# Patient Record
Sex: Male | Born: 1935 | Race: White | Hispanic: No | Marital: Married
Health system: Southern US, Community
[De-identification: ages and names within clinical notes are randomized; demographics above are authoritative.]

---

## 2005-04-11 ENCOUNTER — Other Ambulatory Visit: Payer: Self-pay

## 2005-04-11 ENCOUNTER — Emergency Department: Payer: Self-pay | Admitting: Emergency Medicine

## 2005-04-12 ENCOUNTER — Ambulatory Visit: Payer: Self-pay | Admitting: Emergency Medicine

## 2005-04-20 ENCOUNTER — Ambulatory Visit: Payer: Self-pay | Admitting: Internal Medicine

## 2006-03-12 ENCOUNTER — Ambulatory Visit: Payer: Self-pay | Admitting: Ophthalmology

## 2006-03-27 IMAGING — CT CT ANGIOGRAPHY NECK
1 of 8 series · 8 of 33 positions shown · IV contrast (APPLIED)
Comparison: none

REASON FOR EXAM: Dizziness
COMMENTS:

[Series 5: inspace · axial · 0.39mm/px · z∈[+40,+248]mm · 8 of 535 slices shown]
[im 60/535  soft-tissue]
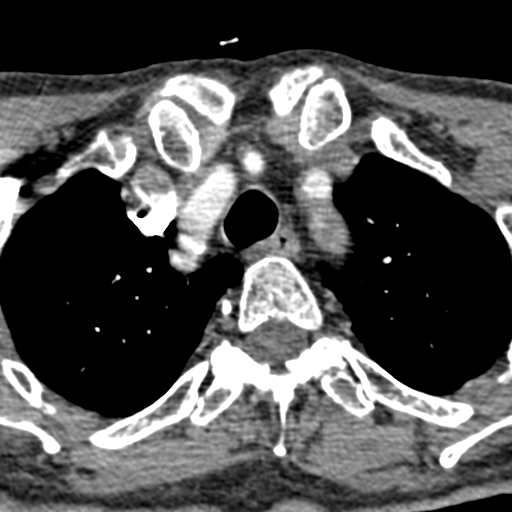
[im 119/535  bone]
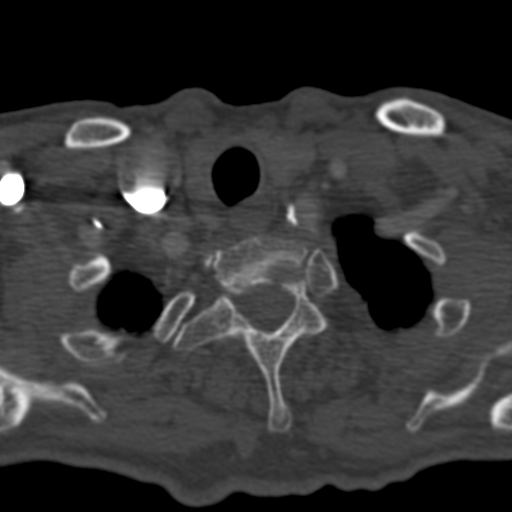
[im 179/535  soft-tissue]
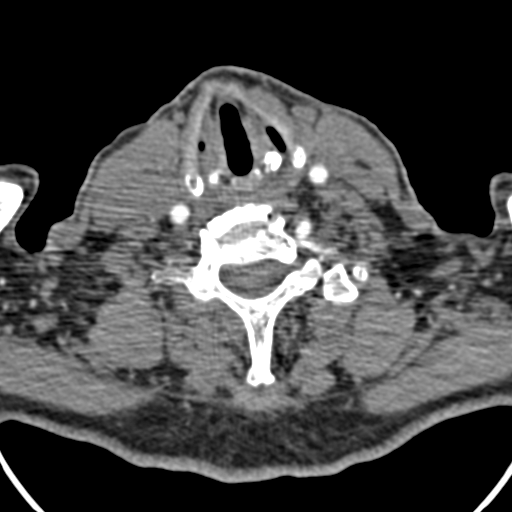
[im 238/535  bone]
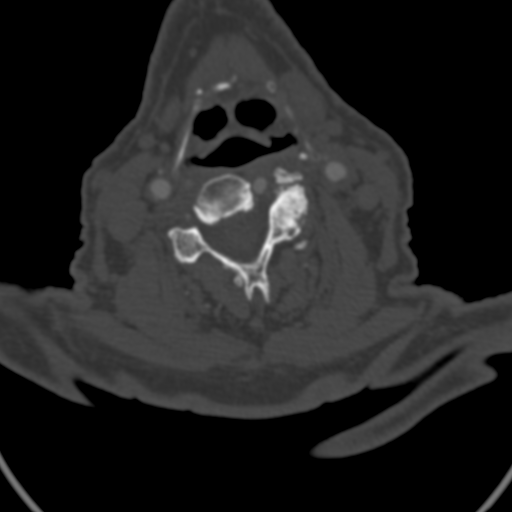
[im 297/535  soft-tissue]
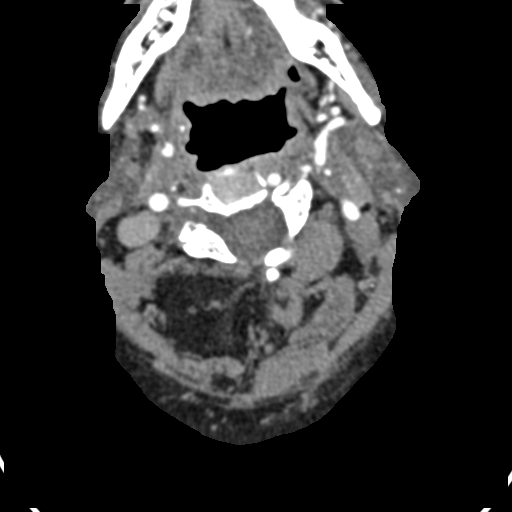
[im 357/535  bone]
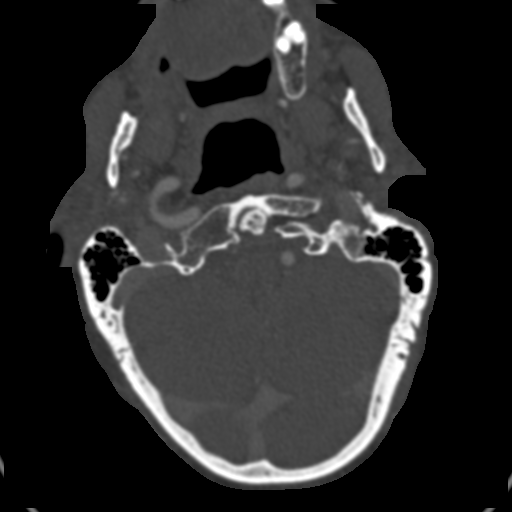
[im 416/535  soft-tissue]
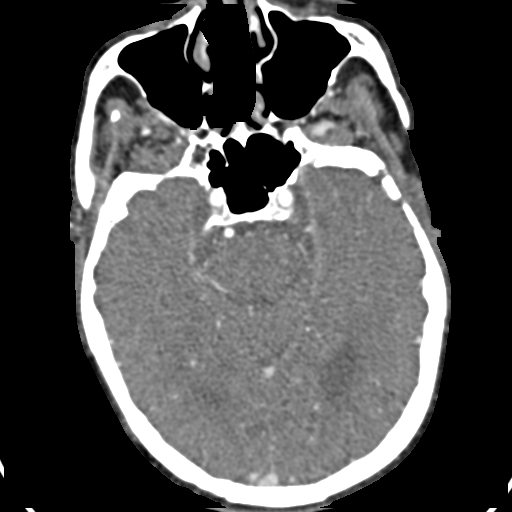
[im 475/535  bone]
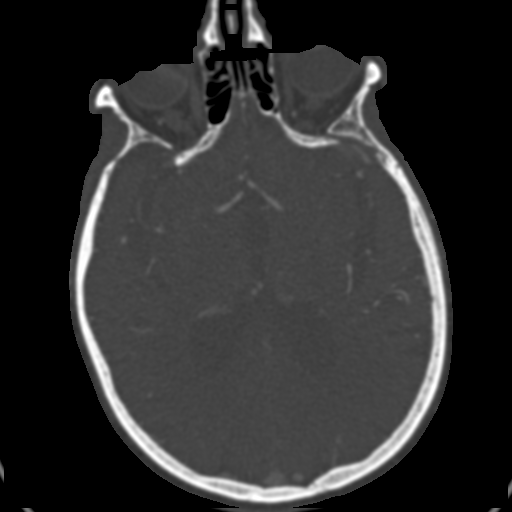

[8 of 33 positions shown; findings below may reference images not displayed]

PROCEDURE:     CT  - CT ANGIOGRAPHY NECK W/WO  - April 20, 2005  [DATE]

RESULT:     Axial source images, 2D maximum intensity projection images as
well as 3D reconstructions were obtained of the RIGHT and LEFT carotid
systems. Evaluation of the RIGHT carotid system demonstrates a coarse mural
plaque in the region of the carotid bulb extending into the proximal RIGHT
internal carotid artery.  This partially obscures visualization of the
carotid bulb and proximal RIGHT carotid artery though the obscuration
appears to be approximately 45 to 50%.  There does not appear to be
significant stenosis within the visualized portions of the proximal RIGHT
carotid artery and bulb.  Evaluation of the remaining RIGHT carotid system
demonstrates no further evaluation of stenosis, focal outpouchings,
strictural narrowing or fusiform dilatation.

Evaluation of the LEFT carotid system demonstrates no evidence of strictural
narrowing, focal outpouchings, or fusiform dilatation.

Evaluation of the RIGHT vertebral artery demonstrates diffuse severe
narrowing of the artery to the level of its origin.  This may be secondary
to diffuse severe atherosclerotic disease or may be secondary to etiology
such as congenital hypoplasia.  The LEFT vertebral artery is opacified and
demonstrates no evidence of focal outpouchings, strictural narrowing,
fusiform dilatation or focal regions of stenosis.
IMPRESSION: 1)Diffuse severe narrowing of the RIGHT vertebral artery as described above
which correlates with the ultrasound findings. It may be secondary as stated
above to diffuse severe atherosclerotic disease or possibly the sequela of
congenital hypoplasia.

2)A calcified mural plaque is demonstrated in the region of the RIGHT
carotid bulb which causes approximately 45 to 50% obscuration of the vessel.
 The remaining visualized portions of the vessel appear patent and do not
demonstrate evidence of high-grade stenosis. This finding can be further
evaluated with MRA if clinically warranted.

3)No further regions of strictural narrowing, fusiform dilatation or focal
outpouchings are appreciated within the RIGHT or LEFT carotid system nor
within the LEFT vertebral system.

## 2007-08-15 ENCOUNTER — Ambulatory Visit: Payer: Self-pay | Admitting: Internal Medicine

## 2007-09-16 ENCOUNTER — Ambulatory Visit: Payer: Self-pay | Admitting: Internal Medicine

## 2007-10-15 ENCOUNTER — Ambulatory Visit: Payer: Self-pay | Admitting: Internal Medicine

## 2008-11-17 ENCOUNTER — Ambulatory Visit: Payer: Self-pay | Admitting: Internal Medicine

## 2010-02-17 ENCOUNTER — Emergency Department: Payer: Self-pay | Admitting: Emergency Medicine

## 2010-04-07 ENCOUNTER — Inpatient Hospital Stay: Payer: Self-pay | Admitting: Unknown Physician Specialty

## 2012-01-21 LAB — CBC
HCT: 43.7 % (ref 40.0–52.0)
HGB: 14.6 g/dL (ref 13.0–18.0)
MCH: 32.9 pg (ref 26.0–34.0)
MCHC: 33.4 g/dL (ref 32.0–36.0)
MCV: 99 fL (ref 80–100)
Platelet: 233 10*3/uL (ref 150–440)
RBC: 4.44 10*6/uL (ref 4.40–5.90)
RDW: 12.9 % (ref 11.5–14.5)
WBC: 6 10*3/uL (ref 3.8–10.6)

## 2012-01-21 LAB — URINALYSIS, COMPLETE
Bacteria: NONE SEEN
Bilirubin,UR: NEGATIVE
Blood: NEGATIVE
Glucose,UR: NEGATIVE mg/dL (ref 0–75)
Hyaline Cast: 1
Ketone: NEGATIVE
Leukocyte Esterase: NEGATIVE
Nitrite: NEGATIVE
Ph: 6 (ref 4.5–8.0)
Protein: NEGATIVE
RBC,UR: NONE SEEN /HPF (ref 0–5)
Specific Gravity: 1.012 (ref 1.003–1.030)
Squamous Epithelial: NONE SEEN
WBC UR: 1 /HPF (ref 0–5)

## 2012-01-21 LAB — COMPREHENSIVE METABOLIC PANEL
Albumin: 3.6 g/dL (ref 3.4–5.0)
Alkaline Phosphatase: 67 U/L (ref 50–136)
Anion Gap: 6 — ABNORMAL LOW (ref 7–16)
BUN: 8 mg/dL (ref 7–18)
Bilirubin,Total: 0.5 mg/dL (ref 0.2–1.0)
Calcium, Total: 8.5 mg/dL (ref 8.5–10.1)
Chloride: 104 mmol/L (ref 98–107)
Co2: 33 mmol/L — ABNORMAL HIGH (ref 21–32)
Creatinine: 1.05 mg/dL (ref 0.60–1.30)
EGFR (African American): >60
EGFR (Non-African Amer.): >60
Glucose: 94 mg/dL (ref 65–99)
Osmolality: 283 (ref 275–301)
Potassium: 2.8 mmol/L — ABNORMAL LOW (ref 3.5–5.1)
SGOT(AST): 22 U/L (ref 15–37)
SGPT (ALT): 14 U/L
Sodium: 143 mmol/L (ref 136–145)
Total Protein: 6.9 g/dL (ref 6.4–8.2)

## 2012-01-21 LAB — LIPASE, BLOOD: Lipase: 616 U/L — ABNORMAL HIGH (ref 73–393)

## 2012-01-22 ENCOUNTER — Inpatient Hospital Stay: Payer: Self-pay | Admitting: Internal Medicine

## 2012-01-23 LAB — CBC WITH DIFFERENTIAL/PLATELET
Basophil #: 0 10*3/uL (ref 0.0–0.1)
Basophil %: 0.2 %
Eosinophil #: 0.1 10*3/uL (ref 0.0–0.7)
Eosinophil %: 2.2 %
HCT: 41.1 % (ref 40.0–52.0)
HGB: 13.8 g/dL (ref 13.0–18.0)
Lymphocyte %: 23.6 %
MCH: 33.1 pg (ref 26.0–34.0)
MCHC: 33.5 g/dL (ref 32.0–36.0)
Neutrophil %: 63.4 %
RDW: 12.9 % (ref 11.5–14.5)
WBC: 6 10*3/uL (ref 3.8–10.6)

## 2012-01-23 LAB — BASIC METABOLIC PANEL
Chloride: 107 mmol/L (ref 98–107)
Co2: 29 mmol/L (ref 21–32)
Creatinine: 0.9 mg/dL (ref 0.60–1.30)
EGFR (African American): >60
EGFR (Non-African Amer.): >60
Glucose: 81 mg/dL (ref 65–99)
Sodium: 145 mmol/L (ref 136–145)

## 2012-01-23 LAB — LIPASE, BLOOD: Lipase: 235 U/L (ref 73–393)

## 2012-01-23 LAB — TSH: Thyroid Stimulating Horm: 1.2 u[IU]/mL

## 2012-01-24 LAB — CBC WITH DIFFERENTIAL/PLATELET
Basophil #: 0 10*3/uL (ref 0.0–0.1)
Basophil %: 0.2 %
Eosinophil #: 0.2 10*3/uL (ref 0.0–0.7)
Eosinophil %: 3.2 %
HCT: 37.6 % — ABNORMAL LOW (ref 40.0–52.0)
HGB: 12.6 g/dL — ABNORMAL LOW (ref 13.0–18.0)
Lymphocyte %: 29.4 %
MCH: 32.8 pg (ref 26.0–34.0)
MCHC: 33.5 g/dL (ref 32.0–36.0)
Monocyte %: 11.6 %
Neutrophil #: 2.7 10*3/uL (ref 1.4–6.5)
Neutrophil %: 55.6 %
Platelet: 198 10*3/uL (ref 150–440)
RBC: 3.84 10*6/uL — ABNORMAL LOW (ref 4.40–5.90)

## 2012-01-24 LAB — BASIC METABOLIC PANEL
BUN: 6 mg/dL — ABNORMAL LOW (ref 7–18)
Calcium, Total: 7.7 mg/dL — ABNORMAL LOW (ref 8.5–10.1)
Chloride: 108 mmol/L — ABNORMAL HIGH (ref 98–107)
EGFR (Non-African Amer.): >60
Osmolality: 285 (ref 275–301)
Potassium: 3.2 mmol/L — ABNORMAL LOW (ref 3.5–5.1)
Sodium: 145 mmol/L (ref 136–145)

## 2012-01-24 LAB — CLOSTRIDIUM DIFFICILE BY PCR

## 2012-01-24 LAB — WBCS, STOOL

## 2012-01-25 LAB — BASIC METABOLIC PANEL
Calcium, Total: 8.2 mg/dL — ABNORMAL LOW (ref 8.5–10.1)
Chloride: 106 mmol/L (ref 98–107)
Co2: 32 mmol/L (ref 21–32)
Creatinine: 0.83 mg/dL (ref 0.60–1.30)
EGFR (African American): >60
Osmolality: 287 (ref 275–301)
Potassium: 3.4 mmol/L — ABNORMAL LOW (ref 3.5–5.1)
Sodium: 144 mmol/L (ref 136–145)

## 2012-01-26 LAB — STOOL CULTURE

## 2012-11-12 ENCOUNTER — Ambulatory Visit: Payer: Self-pay | Admitting: Ophthalmology

## 2013-03-20 ENCOUNTER — Emergency Department: Payer: Self-pay | Admitting: Emergency Medicine

## 2013-03-20 LAB — CBC
HCT: 40.8 % (ref 40.0–52.0)
MCH: 33.4 pg (ref 26.0–34.0)
MCHC: 33.7 g/dL (ref 32.0–36.0)
MCV: 99 fL (ref 80–100)
RBC: 4.12 10*6/uL — ABNORMAL LOW (ref 4.40–5.90)
RDW: 13.2 % (ref 11.5–14.5)
WBC: 5.5 10*3/uL (ref 3.8–10.6)

## 2013-03-20 LAB — URINALYSIS, COMPLETE
Bacteria: NONE SEEN
Bilirubin,UR: NEGATIVE
Blood: NEGATIVE
Ketone: NEGATIVE
Ph: 7 (ref 4.5–8.0)
Protein: NEGATIVE
Specific Gravity: 1.013 (ref 1.003–1.030)
Squamous Epithelial: <1
WBC UR: NONE SEEN /HPF (ref 0–5)

## 2013-03-20 LAB — TROPONIN I: Troponin-I: <0.02 ng/mL

## 2013-03-20 LAB — COMPREHENSIVE METABOLIC PANEL
Albumin: 3.6 g/dL (ref 3.4–5.0)
Alkaline Phosphatase: 67 U/L (ref 50–136)
Calcium, Total: 8.4 mg/dL — ABNORMAL LOW (ref 8.5–10.1)
Chloride: 109 mmol/L — ABNORMAL HIGH (ref 98–107)
Co2: 28 mmol/L (ref 21–32)
Creatinine: 1.01 mg/dL (ref 0.60–1.30)
EGFR (African American): >60
Osmolality: 282 (ref 275–301)
Potassium: 4.1 mmol/L (ref 3.5–5.1)
SGOT(AST): 23 U/L (ref 15–37)
Sodium: 141 mmol/L (ref 136–145)
Total Protein: 6.6 g/dL (ref 6.4–8.2)

## 2013-03-20 LAB — PRO B NATRIURETIC PEPTIDE: B-Type Natriuretic Peptide: 175 pg/mL (ref 0–450)

## 2013-03-20 LAB — CK TOTAL AND CKMB (NOT AT ARMC): CK, Total: 56 U/L (ref 35–232)

## 2013-03-20 LAB — LIPASE, BLOOD: Lipase: 169 U/L (ref 73–393)

## 2014-08-14 ENCOUNTER — Emergency Department: Payer: Self-pay | Admitting: Emergency Medicine

## 2014-08-14 LAB — URINALYSIS, COMPLETE
BILIRUBIN, UR: NEGATIVE
Bacteria: NONE SEEN
Blood: NEGATIVE
GLUCOSE, UR: NEGATIVE mg/dL (ref 0–75)
Ketone: NEGATIVE
Leukocyte Esterase: NEGATIVE
Nitrite: NEGATIVE
PROTEIN: NEGATIVE
Ph: 6 (ref 4.5–8.0)
RBC,UR: NONE SEEN /HPF (ref 0–5)
SPECIFIC GRAVITY: 1.013 (ref 1.003–1.030)
Squamous Epithelial: NONE SEEN

## 2014-08-14 LAB — BASIC METABOLIC PANEL
Anion Gap: 8 (ref 7–16)
BUN: 17 mg/dL (ref 7–18)
CREATININE: 1 mg/dL (ref 0.60–1.30)
Calcium, Total: 8.3 mg/dL — ABNORMAL LOW (ref 8.5–10.1)
Chloride: 108 mmol/L — ABNORMAL HIGH (ref 98–107)
Co2: 27 mmol/L (ref 21–32)
EGFR (African American): >60
GLUCOSE: 91 mg/dL (ref 65–99)
Osmolality: 286 (ref 275–301)
POTASSIUM: 4.1 mmol/L (ref 3.5–5.1)
SODIUM: 143 mmol/L (ref 136–145)

## 2014-08-14 LAB — CBC
HCT: 39.7 % — AB (ref 40.0–52.0)
HGB: 12.9 g/dL — AB (ref 13.0–18.0)
MCH: 32.4 pg (ref 26.0–34.0)
MCHC: 32.5 g/dL (ref 32.0–36.0)
MCV: 100 fL (ref 80–100)
PLATELETS: 336 10*3/uL (ref 150–440)
RBC: 3.98 10*6/uL — ABNORMAL LOW (ref 4.40–5.90)
RDW: 12.8 % (ref 11.5–14.5)
WBC: 7 10*3/uL (ref 3.8–10.6)

## 2014-08-14 LAB — TROPONIN I

## 2014-09-01 ENCOUNTER — Emergency Department: Payer: Self-pay | Admitting: Emergency Medicine

## 2014-09-02 ENCOUNTER — Emergency Department: Payer: Self-pay | Admitting: Emergency Medicine

## 2014-09-08 ENCOUNTER — Inpatient Hospital Stay: Payer: Self-pay | Admitting: Internal Medicine

## 2014-09-08 LAB — BASIC METABOLIC PANEL
Anion Gap: 6 — ABNORMAL LOW (ref 7–16)
BUN: 23 mg/dL — ABNORMAL HIGH (ref 7–18)
CALCIUM: 8.4 mg/dL — AB (ref 8.5–10.1)
CHLORIDE: 108 mmol/L — AB (ref 98–107)
Co2: 26 mmol/L (ref 21–32)
Creatinine: 0.99 mg/dL (ref 0.60–1.30)
EGFR (Non-African Amer.): >60
Glucose: 98 mg/dL (ref 65–99)
OSMOLALITY: 283 (ref 275–301)
Potassium: 4.1 mmol/L (ref 3.5–5.1)
SODIUM: 140 mmol/L (ref 136–145)

## 2014-09-08 LAB — CBC
HCT: 40.2 % (ref 40.0–52.0)
HGB: 12.7 g/dL — AB (ref 13.0–18.0)
MCH: 31.5 pg (ref 26.0–34.0)
MCHC: 31.5 g/dL — AB (ref 32.0–36.0)
MCV: 100 fL (ref 80–100)
Platelet: 267 10*3/uL (ref 150–440)
RBC: 4.03 10*6/uL — AB (ref 4.40–5.90)
RDW: 12.8 % (ref 11.5–14.5)
WBC: 6.8 10*3/uL (ref 3.8–10.6)

## 2014-09-08 LAB — URINALYSIS, COMPLETE
BACTERIA: NONE SEEN
Bilirubin,UR: NEGATIVE
Glucose,UR: NEGATIVE mg/dL (ref 0–75)
Ketone: NEGATIVE
LEUKOCYTE ESTERASE: NEGATIVE
Nitrite: NEGATIVE
Ph: 5 (ref 4.5–8.0)
Protein: NEGATIVE
RBC,UR: 175 /HPF (ref 0–5)
SPECIFIC GRAVITY: 1.024 (ref 1.003–1.030)
Squamous Epithelial: NONE SEEN
WBC UR: 3 /HPF (ref 0–5)

## 2014-09-08 LAB — PROTIME-INR
INR: 1.1
PROTHROMBIN TIME: 13.9 s (ref 11.5–14.7)

## 2014-09-09 LAB — HEMOGLOBIN: HGB: 12.6 g/dL — ABNORMAL LOW (ref 13.0–18.0)

## 2014-09-10 ENCOUNTER — Ambulatory Visit: Payer: Self-pay | Admitting: Internal Medicine

## 2014-09-10 LAB — CBC WITH DIFFERENTIAL/PLATELET
BASOS ABS: 0 10*3/uL (ref 0.0–0.1)
Basophil %: 0.1 %
EOS ABS: 0 10*3/uL (ref 0.0–0.7)
Eosinophil %: 0.3 %
HCT: 33 % — ABNORMAL LOW (ref 40.0–52.0)
HGB: 11 g/dL — ABNORMAL LOW (ref 13.0–18.0)
Lymphocyte #: 0.7 10*3/uL — ABNORMAL LOW (ref 1.0–3.6)
Lymphocyte %: 8.1 %
MCH: 32.9 pg (ref 26.0–34.0)
MCHC: 33.3 g/dL (ref 32.0–36.0)
MCV: 99 fL (ref 80–100)
Monocyte #: 0.7 x10 3/mm (ref 0.2–1.0)
Monocyte %: 8 %
NEUTROS ABS: 7.1 10*3/uL — AB (ref 1.4–6.5)
Neutrophil %: 83.5 %
PLATELETS: 245 10*3/uL (ref 150–440)
RBC: 3.34 10*6/uL — ABNORMAL LOW (ref 4.40–5.90)
RDW: 12.7 % (ref 11.5–14.5)
WBC: 8.5 10*3/uL (ref 3.8–10.6)

## 2014-09-10 LAB — BASIC METABOLIC PANEL
Anion Gap: 4 — ABNORMAL LOW (ref 7–16)
BUN: 18 mg/dL (ref 7–18)
Calcium, Total: 7.7 mg/dL — ABNORMAL LOW (ref 8.5–10.1)
Chloride: 111 mmol/L — ABNORMAL HIGH (ref 98–107)
Co2: 25 mmol/L (ref 21–32)
Creatinine: 1.15 mg/dL (ref 0.60–1.30)
EGFR (Non-African Amer.): >60
Glucose: 139 mg/dL — ABNORMAL HIGH (ref 65–99)
OSMOLALITY: 284 (ref 275–301)
POTASSIUM: 4.1 mmol/L (ref 3.5–5.1)
Sodium: 140 mmol/L (ref 136–145)

## 2014-09-11 LAB — BASIC METABOLIC PANEL
Anion Gap: 4 — ABNORMAL LOW (ref 7–16)
BUN: 12 mg/dL (ref 7–18)
CALCIUM: 7.7 mg/dL — AB (ref 8.5–10.1)
CHLORIDE: 114 mmol/L — AB (ref 98–107)
CO2: 25 mmol/L (ref 21–32)
CREATININE: 0.99 mg/dL (ref 0.60–1.30)
EGFR (African American): >60
EGFR (Non-African Amer.): >60
Glucose: 113 mg/dL — ABNORMAL HIGH (ref 65–99)
OSMOLALITY: 286 (ref 275–301)
Potassium: 3.6 mmol/L (ref 3.5–5.1)
SODIUM: 143 mmol/L (ref 136–145)

## 2014-09-11 LAB — URINALYSIS, COMPLETE
BACTERIA: NONE SEEN
BILIRUBIN, UR: NEGATIVE
BLOOD: NEGATIVE
Glucose,UR: NEGATIVE mg/dL (ref 0–75)
Ketone: NEGATIVE
Leukocyte Esterase: NEGATIVE
NITRITE: NEGATIVE
PH: 5 (ref 4.5–8.0)
Protein: NEGATIVE
RBC,UR: 2 /HPF (ref 0–5)
SPECIFIC GRAVITY: 1.02 (ref 1.003–1.030)
SQUAMOUS EPITHELIAL: NONE SEEN

## 2014-09-11 LAB — HEMOGLOBIN: HGB: 10.4 g/dL — ABNORMAL LOW (ref 13.0–18.0)

## 2014-09-12 LAB — CBC WITH DIFFERENTIAL/PLATELET
BASOS ABS: 0 10*3/uL (ref 0.0–0.1)
BASOS PCT: 0.2 %
EOS PCT: 1 %
Eosinophil #: 0.1 10*3/uL (ref 0.0–0.7)
HCT: 27.3 % — AB (ref 40.0–52.0)
HGB: 9.1 g/dL — ABNORMAL LOW (ref 13.0–18.0)
LYMPHS PCT: 14.2 %
Lymphocyte #: 0.9 10*3/uL — ABNORMAL LOW (ref 1.0–3.6)
MCH: 33 pg (ref 26.0–34.0)
MCHC: 33.4 g/dL (ref 32.0–36.0)
MCV: 99 fL (ref 80–100)
Monocyte #: 0.7 x10 3/mm (ref 0.2–1.0)
Monocyte %: 10.8 %
Neutrophil #: 4.8 10*3/uL (ref 1.4–6.5)
Neutrophil %: 73.8 %
Platelet: 244 10*3/uL (ref 150–440)
RBC: 2.76 10*6/uL — ABNORMAL LOW (ref 4.40–5.90)
RDW: 12.9 % (ref 11.5–14.5)
WBC: 6.5 10*3/uL (ref 3.8–10.6)

## 2014-09-12 LAB — PATHOLOGY REPORT

## 2014-09-12 LAB — BASIC METABOLIC PANEL
Anion Gap: 9 (ref 7–16)
BUN: 15 mg/dL (ref 7–18)
CHLORIDE: 113 mmol/L — AB (ref 98–107)
CREATININE: 0.89 mg/dL (ref 0.60–1.30)
Calcium, Total: 7.6 mg/dL — ABNORMAL LOW (ref 8.5–10.1)
Co2: 22 mmol/L (ref 21–32)
EGFR (African American): >60
EGFR (Non-African Amer.): >60
Glucose: 80 mg/dL (ref 65–99)
Osmolality: 287 (ref 275–301)
POTASSIUM: 3.3 mmol/L — AB (ref 3.5–5.1)
Sodium: 144 mmol/L (ref 136–145)

## 2014-09-13 LAB — BASIC METABOLIC PANEL
ANION GAP: 10 (ref 7–16)
BUN: 20 mg/dL — ABNORMAL HIGH (ref 7–18)
CALCIUM: 7.7 mg/dL — AB (ref 8.5–10.1)
CHLORIDE: 113 mmol/L — AB (ref 98–107)
CO2: 21 mmol/L (ref 21–32)
CREATININE: 0.9 mg/dL (ref 0.60–1.30)
EGFR (Non-African Amer.): >60
GLUCOSE: 66 mg/dL (ref 65–99)
OSMOLALITY: 288 (ref 275–301)
Potassium: 3.6 mmol/L (ref 3.5–5.1)
SODIUM: 144 mmol/L (ref 136–145)

## 2014-09-13 LAB — CBC WITH DIFFERENTIAL/PLATELET
Basophil #: 0 10*3/uL (ref 0.0–0.1)
Basophil %: 0.1 %
Eosinophil #: 0.1 10*3/uL (ref 0.0–0.7)
Eosinophil %: 1.8 %
HCT: 27.9 % — ABNORMAL LOW (ref 40.0–52.0)
HGB: 9.4 g/dL — ABNORMAL LOW (ref 13.0–18.0)
Lymphocyte #: 1.1 10*3/uL (ref 1.0–3.6)
Lymphocyte %: 15.8 %
MCH: 33.6 pg (ref 26.0–34.0)
MCHC: 33.8 g/dL (ref 32.0–36.0)
MCV: 99 fL (ref 80–100)
Monocyte #: 0.6 x10 3/mm (ref 0.2–1.0)
Monocyte %: 8.3 %
NEUTROS ABS: 5 10*3/uL (ref 1.4–6.5)
NEUTROS PCT: 74 %
PLATELETS: 283 10*3/uL (ref 150–440)
RBC: 2.82 10*6/uL — ABNORMAL LOW (ref 4.40–5.90)
RDW: 13.1 % (ref 11.5–14.5)
WBC: 6.8 10*3/uL (ref 3.8–10.6)

## 2014-09-13 LAB — URINE CULTURE

## 2014-09-14 LAB — CBC WITH DIFFERENTIAL/PLATELET
BASOS ABS: 0 10*3/uL (ref 0.0–0.1)
BASOS PCT: 0.2 %
Eosinophil #: 0.2 10*3/uL (ref 0.0–0.7)
Eosinophil %: 2.9 %
HCT: 27.1 % — ABNORMAL LOW (ref 40.0–52.0)
HGB: 8.7 g/dL — AB (ref 13.0–18.0)
Lymphocyte #: 1 10*3/uL (ref 1.0–3.6)
Lymphocyte %: 16.8 %
MCH: 32.1 pg (ref 26.0–34.0)
MCHC: 32.2 g/dL (ref 32.0–36.0)
MCV: 100 fL (ref 80–100)
MONO ABS: 0.5 x10 3/mm (ref 0.2–1.0)
Monocyte %: 9.2 %
NEUTROS ABS: 4 10*3/uL (ref 1.4–6.5)
Neutrophil %: 70.9 %
PLATELETS: 319 10*3/uL (ref 150–440)
RBC: 2.72 10*6/uL — ABNORMAL LOW (ref 4.40–5.90)
RDW: 12.8 % (ref 11.5–14.5)
WBC: 5.7 10*3/uL (ref 3.8–10.6)

## 2014-09-16 LAB — CULTURE, BLOOD (SINGLE)

## 2014-10-11 ENCOUNTER — Ambulatory Visit: Payer: Self-pay | Admitting: Internal Medicine

## 2015-04-03 NOTE — H&P (Signed)
PATIENT NAME:  Cody Daniels, Cody Daniels MR#:  [id number] DATE OF BIRTH:  [birth year]  DATE OF ADMISSION:  09/09/2014  REFERRING PHYSICIAN: Charlestine Night. Scotty Court, MD  PRIMARY CARE PHYSICIAN: Barbette Reichmann, MD, [facility]   CHIEF COMPLAINT: Unwitnessed fall.   HISTORY OF PRESENT ILLNESS: An 82 year old Caucasian gentleman with a history of dementia, hypertension, BPH, depression, presenting after unwitnessed fall at nursing facility. The patient was found on the floor, could not ambulate. Unable to provide any meaningful information given baseline dementia. Found to have right-sided hip fracture.  REVIEW OF SYSTEMS: Unable to obtain given patient's baseline mental status.   PAST MEDICAL HISTORY: Dementia, osteoarthritis, hypertension, BPH, depression.   SOCIAL HISTORY: No documentation of alcohol, tobacco, or drug usage. Resides at a nursing facility.   FAMILY HISTORY: Positive for diabetes.   ALLERGIES: No known allergies.   HOME MEDICATIONS: Aspirin 81 mg p.o. q. daily, Mapap 500 mg 2 tabs p.o. b.i.d., lisinopril 5 mg p.o. q. daily, milk of magnesia 30 mL at bedtime for constipation, Mylanta 400/400/40 mg 30 mL q. 6 hours as needed for indigestion, lorazepam 0.5 mg p.o. q. daily as needed for anxiety, paroxetine 40 mg p.o. q. daily, quetiapine 25 mg p.o. at bedtime, quetiapine 25 mg 1/2 tablet p.o. q.a.m., potassium 10 mEq p.o. q. daily, melatonin 1 mg p.o. at bedtime.   PHYSICAL EXAMINATION:  VITAL SIGNS: Temperature 37.6 axillary, heart rate 95, respirations 18, blood pressure 115/77, saturating 96% on room air. Weight 72.6 kg, BMI 24.3.  GENERAL: Chronically ill-appearing Caucasian gentleman, currently in no acute distress.  HEAD: Normocephalic, atraumatic.  EYES: Pupils equal, round, reactive to light. Extraocular muscles intact. No scleral icterus. MOUTH: Dry mucosal membranes. Dentition intact. No abscess noted. EARS, NOSE, THROAT: Clear without exudates. No external lesions.  NECK:  Supple. No thyromegaly. No nodules. No JVD.  PULMONARY: Clear to auscultation bilaterally without wheezes, rubs, or rhonchi. No use of accessory muscles. Good respiratory effort.  CHEST: Nontender to palpation.  CARDIOVASCULAR: S1, S2, regular rate and rhythm. No murmurs, rubs, or gallops. No edema. Pedal pulses 2+ bilaterally.  GASTROINTESTINAL: Soft, nontender, nondistended. No masses. Positive bowel sounds. No hepatosplenomegaly.  MUSCULOSKELETAL: No swelling, clubbing, or edema noticed. Range of motion limited in the right leg secondary to fracture.  NEUROLOGIC: Unable to fully assess given patient's mental status, as he is unable to follow any commands. He does have spontaneous movements of all extremities.  SKIN: No ulceration, lesions, rash, cyanosis. Skin warm, dry. Turgor intact.  PSYCHIATRIC: Mood and affect flat. He is awake; however, does not appear to be oriented to person, place, or time. He is unable to follow any simple commands. Insight and judgment appear to be poor.   LABORATORY DATA: EKG performed, normal sinus rhythm. No ST or T wave abnormalities. Chest x-ray performed, no acute cardiopulmonary process. X-ray of right hip reveals acute impacted right fracture. Remainder of laboratory data: Sodium 140, potassium 4.1, chloride 108, bicarbonate 26, BUN 23, creatinine 0.99, glucose 98. WBC 6.8, hemoglobin 12.7, platelets of 267,000. Urinalysis: WBCs 3, RBCs 175.   ASSESSMENT AND PLAN: An 81 year old gentleman with a history of dementia, presenting after unwitnessed fall from the nursing facility, found to have a right hip fracture.  1.  Acute impacted right hip fracture. Orthopedic consultation. Pain control. Venous thromboembolism prophylaxis per orthopedics.  2.  Preoperative evaluation. METS considered less than 4. The patient unable to provide any meaningful information. He has no active congestive heart failure or valvular disease based on the physical;  however, he should be  considered a moderate risk for moderate risk surgery. No further interventions or tests required prior to surgery. As far as medications are concerned, hold aspirin and lisinopril preoperatively. 3.  Hypertension. Continue ACE inhibitor, as above.  4.  Depression. Continue quetiapine. 5.  Venous thromboembolism prophylaxis with sequential compression devices.  CODE STATUS: Patient DNR.  TIME SPENT: 45 minutes.    ____________________________ Cletis Athensavid K. Cindy Brindisi, MD dkh:ST D: 09/09/2014 00:20:10 ET T: 09/09/2014 01:13:25 ET JOB#: [id number]  cc: Cletis Athensavid K. Tyrus Wilms, MD, <Dictator> Phillippe Orlick Synetta ShadowK Buelah Rennie MD ELECTRONICALLY SIGNED 09/16/2014 20:35

## 2015-04-03 NOTE — Discharge Summary (Signed)
PATIENT NAME:  Cody Daniels, Cody Daniels MR#:  [id number] DATE OF BIRTH:  November 14, 1924  DATE OF ADMISSION:  09/08/2014 DATE OF DISCHARGE:  09/14/2014  ADDENDUM:   The patient's discharge was held over the weekend because of a fever. Urinalysis and chest x-ray was negative. The patient did not have any obvious sores.  His temperature, subsequently, settled. He was also noted to be excessively sedated, attributed to the olanzapine and this was therefore discontinued. The patient was stable at the time of discharge and discharged to rehabilitation on 09/14/2014.   Total time spent in discharge of pt: 35 minutes   ____________________________ Barbette ReichmannVishwanath Azariah Bonura, MD vh:lr D: 09/14/2014 12:35:20 ET T: 09/14/2014 13:59:02 ET JOB#: [id number]  cc: Barbette ReichmannVishwanath Bodin Gorka, MD, <Dictator> Barbette ReichmannVISHWANATH Miguelangel Korn MD ELECTRONICALLY SIGNED 09/17/2014 13:14

## 2015-04-03 NOTE — Consult Note (Signed)
Brief Consult Note: Diagnosis: diuspalced right femoral neck fracture.   Orders entered.   Comments: plan left hip hemiarthroplasty.  Electronic Signatures: Leitha SchullerMenz, Curvin Hunger J (MD)  (Signed 29-Sep-15 22:48)  Authored: Brief Consult Note   Last Updated: 29-Sep-15 22:48 by Leitha SchullerMenz, Brendan Gruwell J (MD)

## 2015-04-03 NOTE — Op Note (Signed)
PATIENT NAME:  Cody Daniels, Cody Daniels MR#:  [id number] DATE OF BIRTH:  October 06, 1924  DATE OF PROCEDURE:  09/09/2014  PREOPERATIVE DIAGNOSIS:  Displaced right femoral neck fracture.   POSTOPERATIVE DIAGNOSIS:  Displaced right femoral neck fracture.   PROCEDURE:  Right anterior hip hemiarthroplasty.   ANESTHESIA:  Spinal.   SURGEON:  Leitha SchullerMichael J. Dejae Bernet, MD   DESCRIPTION OF PROCEDURE:  The patient was brought to the operating room, and after adequate spinal anesthesia was obtained, the patient was placed on the operative table with the right foot in the Medacta attachment. After prepping and draping in the usual sterile fashion, appropriate patient identification and timeout procedures were completed with a preoperative x-ray taken. Initially, the neck was very far impacted into the head with posterior displacement of the head. After a long traction to sit for while prepping, the fracture appeared more anatomic, and preoperative template was obtained from the x-ray. Direct anterior approach was used with incision centered over the greater trochanter and tensor fasciae latae muscle after patient identification and timeout procedures were completed. An incision was carried down through the skin and subcutaneous tissue with the tensor fasciae identified, elevated, and retracted posteriorly. The lateral femoral circumflex vessels were ligated, and after elevating the rectus, the anterior capsule was exposed. A capsulotomy was carried out, and there was hemarthrosis present consistent with acute fracture. A femoral neck cut was made at the appropriate level based on x-ray findings, and the head and neck were removed with mild arthritis present within the hip. The hip measured to a 57 mm, and the 57 mm trial fit well. The leg was then externally rotated, and a pubofemoral and ischiofemoral partial release was carried out to allow for adequate mobilization of the proximal femur. The leg was dropped into extension, and  sequential broaching was carried out up to a size 5, which gave good fill, but it did appear to have more anteversion than is typically noted but appeared appropriate for this patient. The 5 stem was then impacted down the canal with an S-28 mm head and the 57 mm bipolar head. The bipolar head was placed into the acetabulum with the S head on the stem. The hip was internally rotated with traction, and then traction was released and the bipolar components assembled inside the acetabulum. The hip was stable through range of motion including 90 degrees of external rotation. The wound was then thoroughly irrigated. The capsule was repaired, with the patient's confusion to try to aid in diminishing the risk of anterior dislocation postoperatively, with Ethibond suture repair of the anterior capsule, heavy Quill for the tensor fasciae muscle, and 2-0 Quill subcutaneously followed by skin staples, Xeroform, 4 x 4's, ABD,  and tape. The patient was sent to the recovery room in stable condition.   ESTIMATED BLOOD LOSS:  400 mL.   COMPLICATIONS:  None.   SPECIMEN:  Removed femoral head and neck.   IMPLANTS:  Medacta 5 AMIStem with an S-28 head and a 57 mm bipolar head.   CONDITION:  There were again no complications. Stable to the recovery room.    ____________________________ Leitha SchullerMichael J. Maude Hettich, MD mjm:nb D: 09/09/2014 20:52:46 ET T: 09/10/2014 02:11:45 ET JOB#: [id number]  cc: Leitha SchullerMichael J. Shalaunda Weatherholtz, MD, <Dictator> Leitha SchullerMICHAEL J Aarsh Fristoe MD ELECTRONICALLY SIGNED 09/13/2014 22:04

## 2015-04-03 NOTE — Discharge Summary (Signed)
PATIENT NAME:  Cody Daniels, Cody Daniels MR#:  [id number] DATE OF BIRTH:  [birth year]  DATE OF ADMISSION:  09/08/2014 DATE OF DISCHARGE:  09/14/2014   DIAGNOSES AT THE TIME OF DISCHARGE:   1.  Fall with impacted right hip fracture, status post right hip hemiarthroplasty.  2.  Hypertension.  3.  Dementia.  4.  Anxiety and agitation.  5   Anemia secondary to blood loss  CHIEF COMPLAINT: Difficulty in ambulation, pain right hip.   HISTORY OF PRESENT ILLNESS: Cody Daniels is an 81 year old gentleman with a history of dementia, hypertension, BPH, depression who presented after he fell at the nursing facility. The patient was found on the floor unable to ambulate and x-ray showed evidence for impacted right femoral neck fracture. The patient was seen by orthopedics and underwent a right hemiarthroplasty by Dr. Rosita KeaMenz. Past medical history significant for dementia, osteoarthritis, hypertension, BPH and depression. Please see H and P for other details.   HOSPITAL COURSE: During his stay in the hospital, the patient had episodes of anxiety and agitation and needed intravenous Haldol. He was also seen by palliative care who recommended Seroquel in addition to olanzapine. The patient also received physical therapy. His hemoglobin did drop slightly to 10.4. This was felt to be due to postop blood loss but the patient overall appeared stable at the time of discharge. He was discharged in stable condition and advised to undergo further physical therapy and was sent to rehab.   MEDICATIONS: The patient's medications on discharge:  Paxil 40 mg once a day, melatonin 1 mg at bedtime, acidophilus 1 capsule once a day, lisinopril 5 mg a day, KCl 10 mEq once a day, Ativan 0.5 mg po q 6hrs prn for anxiety., Trazodone 25 mg p.o. at bedtime, Haldol 2 mg q.6 p.r.n. for agitation, gabapentin 100 mg t.i.d., Risamine topical ointment to affected area twice a day and triple antibiotic cream apply 4 times a day as needed for skin tag  abrasions. The patient was also advised to continue his Lovenox 40 mg subcutaneous daily for 10 more days.    DISCHARGE INSTRUCTIONS: The patient was advised physical therapy and advised to have a CBC and metabolic panel checked in 1 week. He was stable at the time of discharge.   TOTAL TIME SPENT ON DISCHARGING PATIENT: 35 minutes.    ____________________________ Barbette ReichmannVishwanath Joseth Weigel, MD vh:AT D: 09/11/2014 14:55:19 ET T: 09/14/2014 05:09:27 ET JOB#: [id number]  cc: Barbette ReichmannVishwanath Anajulia Leyendecker, MD, <Dictator> Barbette ReichmannVISHWANATH Kerim Statzer MD ELECTRONICALLY SIGNED 09/17/2014 13:14

## 2015-04-03 NOTE — Consult Note (Signed)
PATIENT NAME:  Cody Daniels, Cody Daniels MR#:  [id number] DATE OF BIRTH:  05-08-1924  DATE OF CONSULTATION:  09/09/2014  REFERRING PHYSICIAN:  Emergency Room  CONSULTING PHYSICIAN:  Leitha SchullerMichael J. Chrissa Meetze, MD  REASON FOR CONSULTATION: Right hip fracture.   HISTORY OF PRESENT ILLNESS: The patient is an 81 year old nursing home resident who slipped out of a wheelchair apparently. He is unable to give a history as he is quite confused. He had obvious deformity to the leg and was brought to the Emergency Room where he was found to have a displaced femoral neck fracture. Initial report was impacted, but it is actually completely displaced posteriorly. He lives in a nursing home and I do not know his ambulatory status.   PHYSICAL EXAMINATION: Pertinent to the right lower extremity is a shortened and externally rotated leg. Skin is intact. He does have palpable pulses, and does flex and extend the toes. He cannot cooperate with the exam and cannot answer questions.   IMAGING: X-rays show a completely displaced femoral neck fracture without significant osteoarthritis.   IMPRESSION: Displaced femoral neck fracture.   RECOMMENDATION: For right hip hemiarthroplasty, anterior approach, with postoperative Lovenox. We will try to do that later today. No family is present this morning. I will try to discuss this with his son later today for informed consent.     ____________________________ Leitha SchullerMichael J. Lanea Vankirk, MD mjm:MT D: 09/09/2014 06:53:47 ET T: 09/09/2014 08:09:29 ET JOB#: [id number]  cc: Leitha SchullerMichael J. Moncia Annas, MD, <Dictator> Leitha SchullerMICHAEL J Solace Manwarren MD ELECTRONICALLY SIGNED 09/09/2014 13:23

## 2015-04-04 NOTE — Consult Note (Signed)
Chief Complaint:   Subjective/Chief Complaint Feels much better. No significant diarrhea. No abdominal pain. Stool studies negative. No further recommendations. Will sign off. Please reconsult if needed. Thanks.   Electronic Signatures: Lurline DelIftikhar, Ardena Gangl (MD)  (Signed 13-Feb-13 10:42)  Authored: Chief Complaint   Last Updated: 13-Feb-13 10:42 by Lurline DelIftikhar, Mykell Rawl (MD)

## 2015-04-04 NOTE — Consult Note (Signed)
Brief Consult Note: Comments: Patient seen. Full consult dictated. Will wait for stool studies and TSH and make further recommendations.  Electronic Signatures: Lurline DelIftikhar, Aryaman Haliburton (MD)  (Signed 11-Feb-13 23:56)  Authored: Brief Consult Note   Last Updated: 11-Feb-13 23:56 by Lurline DelIftikhar, Azhia Siefken (MD)

## 2015-04-04 NOTE — Discharge Summary (Signed)
PATIENT NAME:  Cody KnucklesWADE, Hamad C MR#:  [id number] DATE OF BIRTH:  [birth year]  DATE OF ADMISSION:  01/22/2012 DATE OF DISCHARGE:  01/25/2012  DISCHARGE DIAGNOSES:  1. Diarrhea. 2. Weakness. 3. Hypokalemia. 4. Advanced dementia. 5. Osteoarthritis. 6. Hypertension. 7. Pancreatitis.   CHIEF COMPLAINT: Diarrhea, abdominal discomfort.   HISTORY OF PRESENT ILLNESS: Barrie LymeWilliam Henken is an 81 year old man who comes from home complaining of ongoing diarrhea. According to his son, the patient states that he has been having diarrhea for the last few weeks, sometimes up to five times a day, loose and watery, but no blood in it. The patient has also been complaining of some vague upper abdominal pain. No fevers, no chills, and no vomiting. PO intake has been good.  PAST MEDICAL HISTORY:  1. Dementia. 2. Osteoarthritis. 3. Hypertension. 4. Benign prostatic hypertrophy. 5. Depression. 6. Osteoporosis.   PAST SURGICAL HISTORY:  1. Hernia repair. 2. Left total knee replacement.  3. Right distal femur fracture. 4. Right hernia surgery.    PHYSICAL EXAMINATION: He was afebrile. Pulse was 68, respirations 24, blood pressure 149/103, and saturations were 95% on room air. He was not in distress. HEENT was normocephalic, atraumatic. Sclera was anicteric. Neck was supple. No JVD. Heart with S1 and S2. Lungs had decreased air entry bilaterally, no crackles or wheezes. Abdomen was soft, mild epigastric tenderness present. No rebound or guarding. Extremities showed no edema. Neurologically he was alert and oriented x1. He was confused, but no obvious focal deficits noted. Skin was moist, no rash appreciated.   LABS/STUDIES: Glucose 94, BUN 8, creatinine 1.05, sodium 143, potassium 2.8, chloride 104, bicarbonate 33. Magnesium 2.1.   Urinalysis was normal.   Hemoglobin 14.6, hematocrit 43.7, and platelets 233. Serum lipase was elevated at 616. Repeat serum lipase was down to 235. TSH was 1.2.  Stool was negative  for Clostridium difficile.   EKG showed sinus bradycardia, but otherwise no acute changes.  HOSPITAL COURSE: The patient was [facility] and received IV fluids. He was kept on a liquid diet. His diet was gradually advanced. His repeat serum amylase was normal. He was also seen by gastroenterologist, Dr. Lurline DelShaukat Iftikhar, and stool studies were negative. The diarrhea had resolved, abdominal had also resolved, and the patient was stable at the time of discharge.  Pt's diarrhea may have been from his meds and his Namenda and Seroquel was d/cd   DISCHARGE MEDICATIONS: 1. Donepezil 10 mg at bedtime.  2. Lisinopril 10 mg a day. 3. Paxil 20 mg a day.  4. Aspirin 81 mg a day.        7. The patient also had a PPD placed, on 01/23/2012, and this has to be read within 48 to 72 hours.   DISCHARGE CONDITION: The patient was stable at the time of discharge. ____________________________ Barbette ReichmannVishwanath Meric Joye, MD vh:slb D: 01/25/2012 13:01:50 ET T: 01/25/2012 13:22:11 ET JOB#: [id number]  cc: Barbette ReichmannVishwanath Kaidence Sant, MD, <Dictator> Barbette ReichmannVISHWANATH Dorsie Burich MD ELECTRONICALLY SIGNED 02/02/2012 15:26

## 2015-04-04 NOTE — H&P (Signed)
PATIENT NAME:  Cody Daniels, Cody Daniels MR#:  [id number] DATE OF BIRTH:  12/05/1924  DATE OF ADMISSION:  01/22/2012  PRIMARY CARE PHYSICIAN: Dr. Marcello Fennel   CHIEF COMPLAINT: Diarrhea.   HISTORY OF PRESENT ILLNESS: This is an 81 year old male who comes from home due to ongoing diarrhea. The patient himself has underlying dementia; therefore, most of the history is obtained over the phone with the son. As per the son, the patient has been having on and off diarrhea for a few weeks. It has no relation to food. He has diarrhea usually 3 to 4 times, sometimes even 5 times a day.  It is loose, watery, nonbloody in nature. He denies any abdominal pain. There has been no fever. No nausea. No vomiting. The patient has had good p.o. intake. As per the son, the patient has not started any new medication that could be causing his diarrhea. Due to the fact that his diarrhea has been progressively getting worse he was brought to the ER for further evaluation.   REVIEW OF SYSTEMS:  CONSTITUTIONAL: No documented fever. No weight gain or weight loss. EYES: No blurred or double vision. ENT: No tinnitus or postnasal drip. No redness of the oropharynx. RESPIRATORY: No cough, no wheeze, no hemoptysis. CARDIOVASCULAR: No chest pain, no orthopnea, no palpitations, no syncope. GI: No nausea, no vomiting. Positive diarrhea. No abdominal pain, no melena or hematochezia. GU: No dysuria or hematuria. ENDOCRINE: No polyuria, nocturia, or heat or cold intolerance.  HEME: No anemia, no bruising, no bleeding. INTEGUMENT: No rashes, no lesions. MUSCULOSKELETAL: No arthritis, no swelling, no gout. NEUROLOGIC: No numbness, no tingling, no ataxia, no seizure-type activity. PSYCH: No anxiety, no insomnia, no ADD.   PAST MEDICAL HISTORY:  1. Dementia.  2. Osteoarthritis.  3. Hypertension.  4. Benign prostatic hypertrophy.  5. Depression.  6. Osteoporosis.    PAST SURGICAL HISTORY:  1. Hernia repair.  2. Left total knee replacement.  3. Right  distal femur fracture. 4. Right hernia surgery.   ALLERGIES: No known drug allergies.   SOCIAL HISTORY: No smoking. No alcohol abuse. No illicit drug abuse. Lives at home with his daughter.   FAMILY HISTORY: Both mother and father had diabetes, also died from complications of heart disease.   CURRENT MEDICATIONS:  1. Aspirin 81 mg daily.  2. Lisinopril 10 mg daily.  3. Tramadol 50 mg every six hours p.r.n.  4. Seroquel 25 mg daily.  5. Paxil 20 mg daily.  6. Aricept 10 mg at bedtime.  7. Namenda 5 mg b.i.d.   PHYSICAL EXAMINATION:  VITAL SIGNS: Temperature 97.4, pulse 68, respirations 24, blood pressure 149/103, saturations 95% on room air.   GENERAL: He is a pleasant appearing male in no apparent distress.   HEENT: Atraumatic, normocephalic. Extraocular muscles are intact. Pupils are equal and reactive to light. Sclerae anicteric. No conjunctival injection. No pharyngeal erythema.   NECK: Supple. No jugular venous distention, no bruits, no lymphadenopathy, no thyromegaly.   HEART: Regular rate and rhythm. No murmurs, rubs, or clicks.   LUNGS: Clear to auscultation bilaterally. No rales, no rhonchi, no wheezes.   ABDOMEN: Soft, flat, nontender, nondistended. Has good bowel sounds. No hepatosplenomegaly appreciated.   EXTREMITIES: No evidence of any cyanosis, clubbing, or peripheral edema. Has +2 pedal and radial pulses bilaterally.   NEUROLOGICAL: He is alert, awake, and oriented times one. Moves all extremities spontaneously. Difficult to do a full neurological exam.   SKIN: Moist and warm with no rashes appreciated.   LYMPHATIC: There  is no cervical or axillary lymphadenopathy.   LABORATORY, DIAGNOSTIC, AND RADIOLOGICAL DATA: Serum glucose 94, BUN 8, creatinine 1.05, sodium 143, potassium 2.8, chloride 104, bicarbonate 33. The patient's magnesium is 2.1. LFTs are within normal limits. White cell count 6, hemoglobin 14.6, hematocrit 43.7, platelet count 233. Urinalysis is  within normal limits.   ASSESSMENT AND PLAN: This is an [age] year old male with a history of dementia, osteoarthritis, benign prostatic hypertrophy, osteoporosis, depression, and hypertension who presents to the hospital with ongoing diarrhea, noted to be hypokalemic.  1. Diarrhea: This is likely acute on chronic diarrhea. The exact etiology is currently unclear. I will go ahead and check stool for C. difficile, comprehensive cultures, ova and parasites. Continue supportive care. We will start him on some p.r.n. Imodium. Also get a GI consult.  2. Hypokalemia: This is likely secondary to the ongoing diarrhea. I will go ahead and replace his potassium and repeat. His mag level is currently normal.  3. Hypertension: Continue lisinopril.  4. Dementia with psychosis: Continue Aricept, Namenda, Seroquel, and Paxil.  5. Osteoarthritis: Continue p.r.n. tramadol.   CODE STATUS: The patient is a FULL CODE.   TIME SPENT: 45 minutes.  ____________________________ Rolly PancakeVivek J. Cherlynn KaiserSainani, MD vjs:bjt D:  01/22/2012 00:30:43 ET         T: 01/22/2012 08:18:18 ET        JOB#: [id number]  cc: Barbette ReichmannVishwanath Hande, MD [place] SirenVIVEK J Isiaha Greenup MD ELECTRONICALLY SIGNED 01/26/2012 10:36

## 2015-04-04 NOTE — Consult Note (Signed)
PATIENT NAME:  Cody Daniels, HALIBURTON MR#:  [id number] DATE OF BIRTH:  [birth year]  DATE OF CONSULTATION:  01/22/2012  REFERRING PHYSICIAN:  Rolly Pancake. Cherlynn Kaiser, MD  CONSULTING PHYSICIAN:  Lurline Del, MD  REASON FOR CONSULTATION: Diarrhea.   HISTORY OF PRESENT ILLNESS: The patient is an 81 year old male with dementia. The patient is not a good historian. Apparently the patient has been having loose bowel movements for the last several weeks. The history is obtained from the chart as no family member is available. Apparently the patient has been having 3 to 5 bowel movements a day. The bowel movements are loose and watery but nonbloody. He denies any abdominal pain, denies any nausea or vomiting. According to him, he just does not see well. There are no reports of any sick contacts, and no other significant symptoms were reported by the patient.   PAST MEDICAL HISTORY:  1. History of dementia. 2. Osteoarthritis. 3. Hypertension. 4. Benign prostatic hypertrophy. 5. Depression. 6. Osteoporosis.   PAST SURGICAL HISTORY:  1. Hiatal hernia repair. 2. Left total knee replacement.  3. Right distal femur fracture. 4. Right hernia surgery.   ALLERGIES: None.   SOCIAL HISTORY: He does not smoke or drink.   FAMILY HISTORY: Not available.   REVIEW OF SYSTEMS: Review of systems is grossly negative except for what is mentioned in the History of Present Illness.  HOME MEDICATIONS:  1. Aspirin 81 mg a day. 2. Lisinopril 10 mg a day. 3. Tramadol 50 mg every 6 hours p.r.n.  4. Seroquel 25 mg a day. 5. Paxil 20 mg a day. 6. Aricept 10 mg at bedtime. 7. Namenda 5 mg b.i.d.   PHYSICAL EXAMINATION:  GENERAL: An elderly, frail-appearing male who does not appear to be in any acute distress.   VITAL SIGNS: The patient has been afebrile since admission. Heart rate is in the 60s, respirations 18, blood pressure 175/94.   NECK: Neck veins are flat.   LUNGS: Grossly clear to auscultation bilaterally.    CARDIOVASCULAR: Regular rate and rhythm. No gallops or murmur.   ABDOMEN: Examination reveals hyperactive bowel sounds.  The abdomen is slightly distended. Mildly tender in the periumbilical area on palpation, although there is no rebound or guarding.   EXTREMITIES: Trace pedal edema bilaterally.   NEUROLOGIC: Examination appears to be unremarkable.   LABORATORY, DIAGNOSTIC AND RADIOLOGICAL DATA:  White cell count is normal. Hemoglobin and hematocrit are normal as well.  Serum lipase is 616. Liver enzymes are normal. Potassium is low at 2.8. Other electrolytes, BUN, and creatinine are normal.   ASSESSMENT AND PLAN: The patient is with diarrhea for the last several weeks, hyperactive bowel sounds on physical examination. There is no history of sick contact or use of any new medications. The patient does not appear to be septic or toxic, and his white cell count is normal. The list of differential is long, and I would begin with obtaining stool studies for white blood cells, culture, C. difficile toxin, and ova and parasites. Further recommendations to follow depending on the results of the stool studies. The patient may require further studies such as a CT scan plus/minus a colonoscopy for further evaluation. Meanwhile, I will treat him with supportive care and correction of electrolytes. I would also recommend checking his TSH. The patient's lipase is high, although clinically there are no signs of acute pancreatitis and the like. This will be repeated in the morning as well. Further recommendations to follow. We will follow.   Thank you  so much, Dr. Cherlynn KaiserSainani.   ____________________________ Lurline DelShaukat Charlea Nardo, MD si:cbb D: 01/22/2012 23:55:07 ET T: 01/23/2012 09:47:09 ET JOB#: [id number]  cc: Lurline DelShaukat Sharmin Foulk, MD, <Dictator> Lurline DelSHAUKAT Josefita Weissmann MD ELECTRONICALLY SIGNED 01/25/2012 12:58

## 2015-04-04 NOTE — Consult Note (Signed)
Chief Complaint:   Subjective/Chief Complaint Overall better. No significant diarrhea. Abdominal examination is benign. Lipase improved.  Recommendations: Slowly advance diet. Stool studies when a sample is available. Will follow. Stool studies still pending.   Electronic Signatures: Lurline DelIftikhar, Tiwana Chavis (MD)  (Signed 12-Feb-13 18:25)  Authored: Chief Complaint   Last Updated: 12-Feb-13 18:25 by Lurline DelIftikhar, Nachmen Mansel (MD)

## 2015-08-15 IMAGING — CR DG CHEST 1V
1 series · 1 of 1 positions shown · non-contrast
Comparison: 09/01/2014

CLINICAL DATA: Preop hip fracture

EXAM:
CHEST - 1 VIEW

[dxr chest 1 viewap or pa]
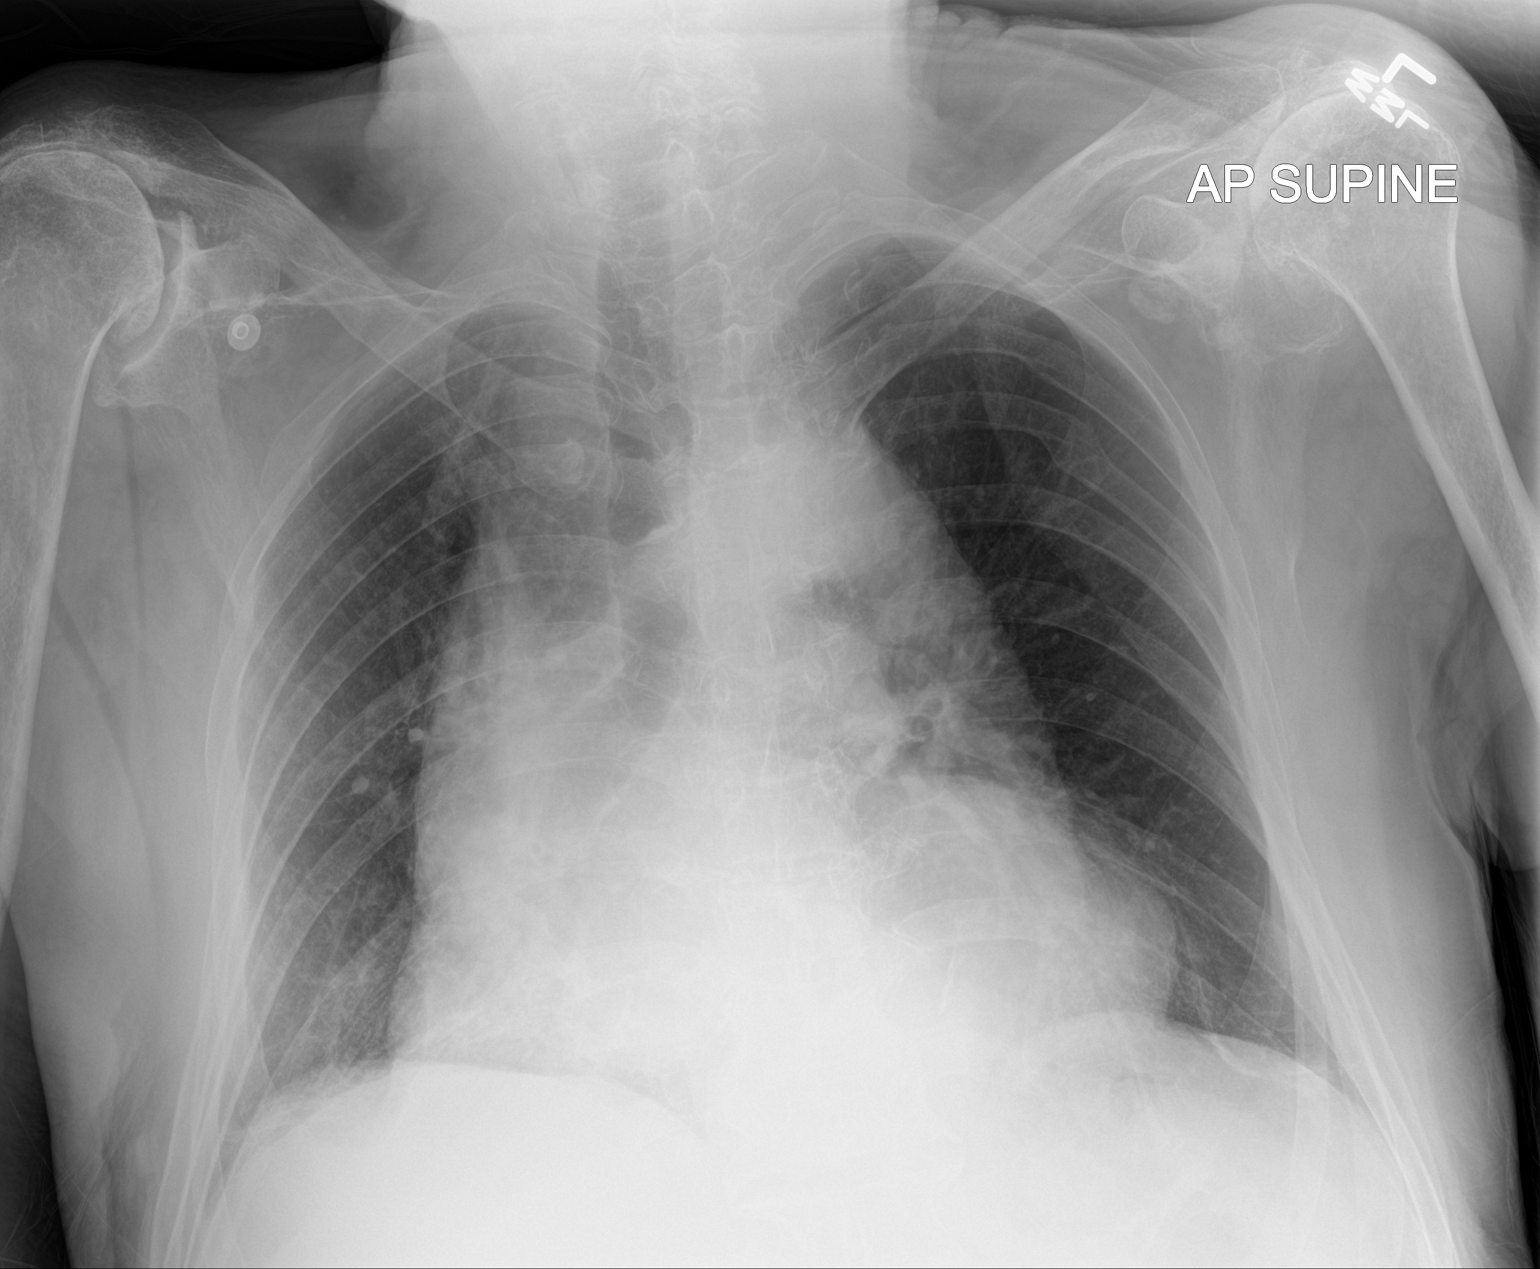

[1 of 1 positions shown; findings below may reference images not displayed]

FINDINGS: Lungs are essentially clear. No focal consolidation. No pleural
effusion or pneumothorax.

Cardiomegaly. Mediastinal prominence is likely projectional given
change from recent CT.

Degenerative changes of the bilateral shoulders.

Old left rib fractures.
IMPRESSION: No evidence of acute cardiopulmonary disease.

## 2015-08-15 IMAGING — CR RIGHT HIP - COMPLETE 2+ VIEW
1 series · 3 of 3 positions shown · non-contrast
Comparison: CT abdomen pelvis 03/30/2010

CLINICAL DATA: Right hip pain on

EXAM:
RIGHT HIP - COMPLETE 2+ VIEW

[Series 1: dxr hip right complete · 0.14mm/px · 3 of 3 slices shown]
[im 1/3]
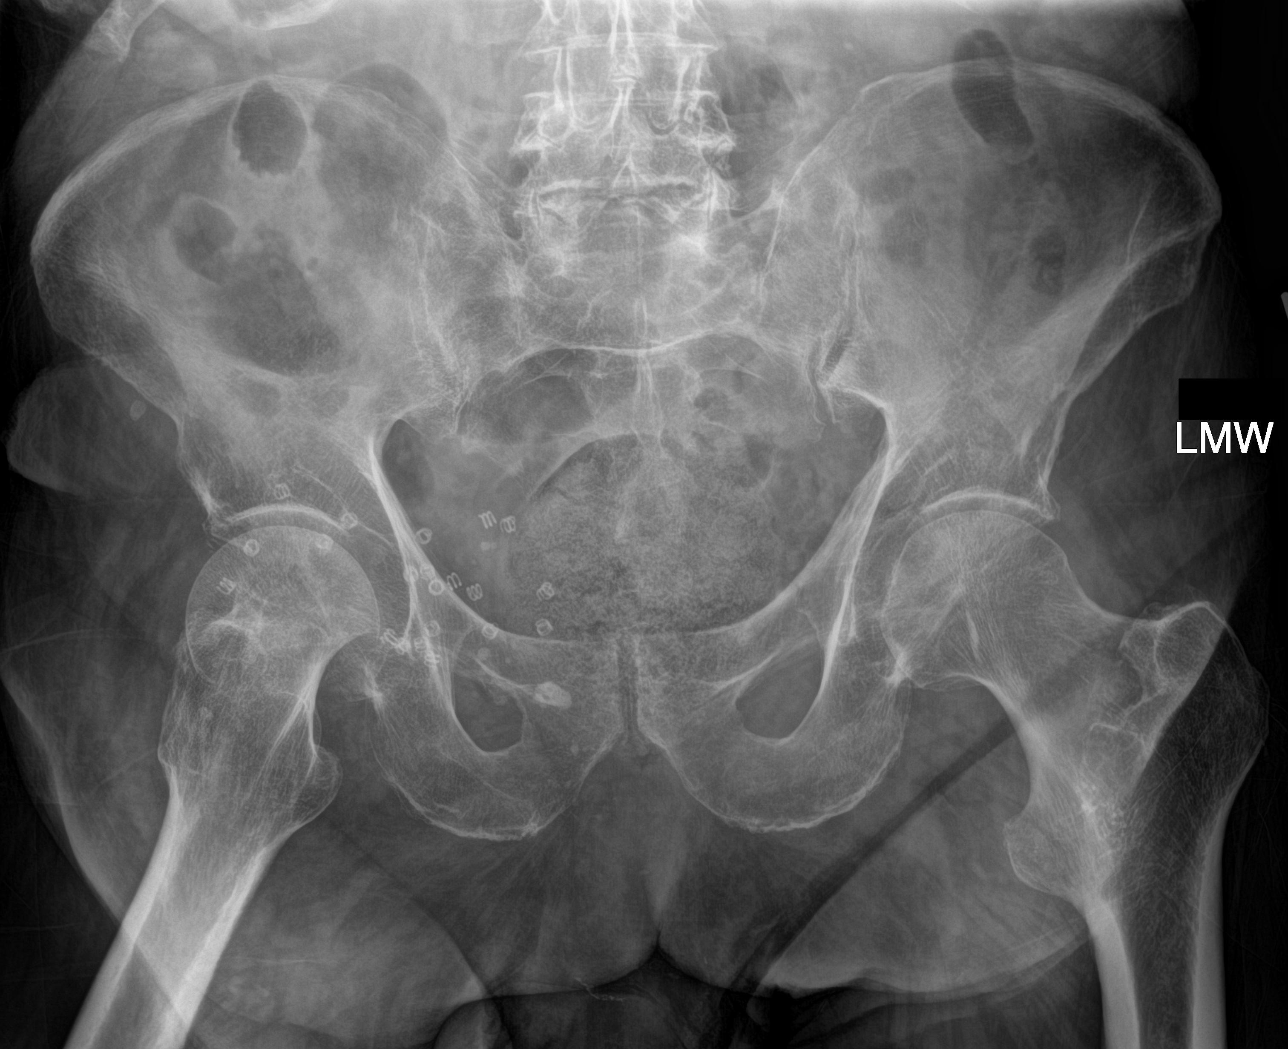
[im 2/3]
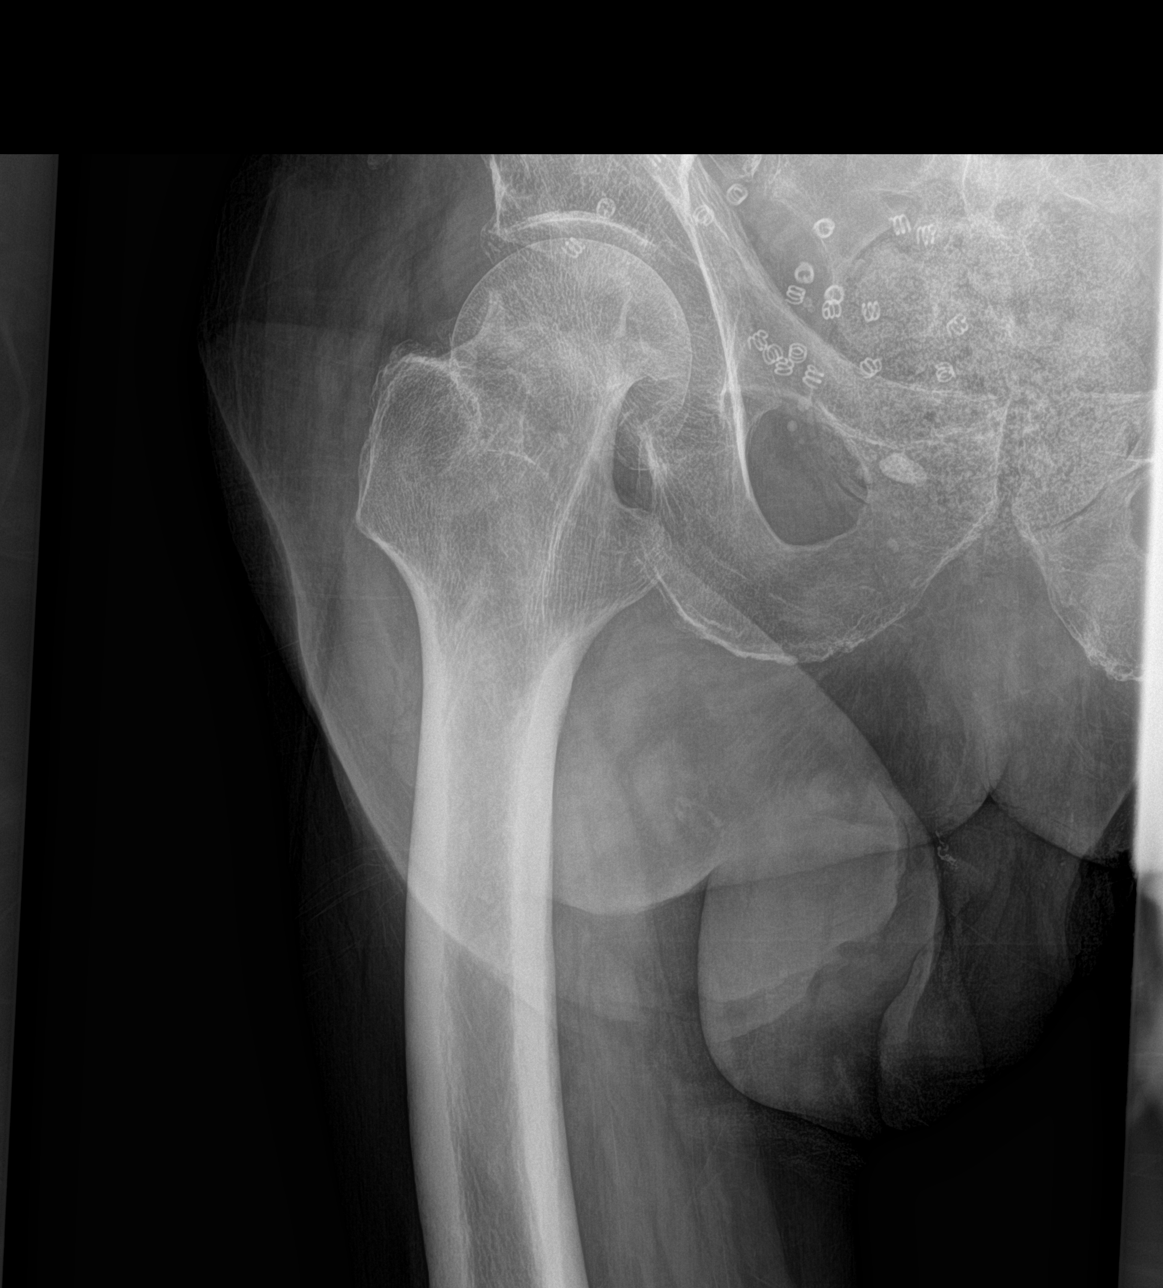
[im 3/3]
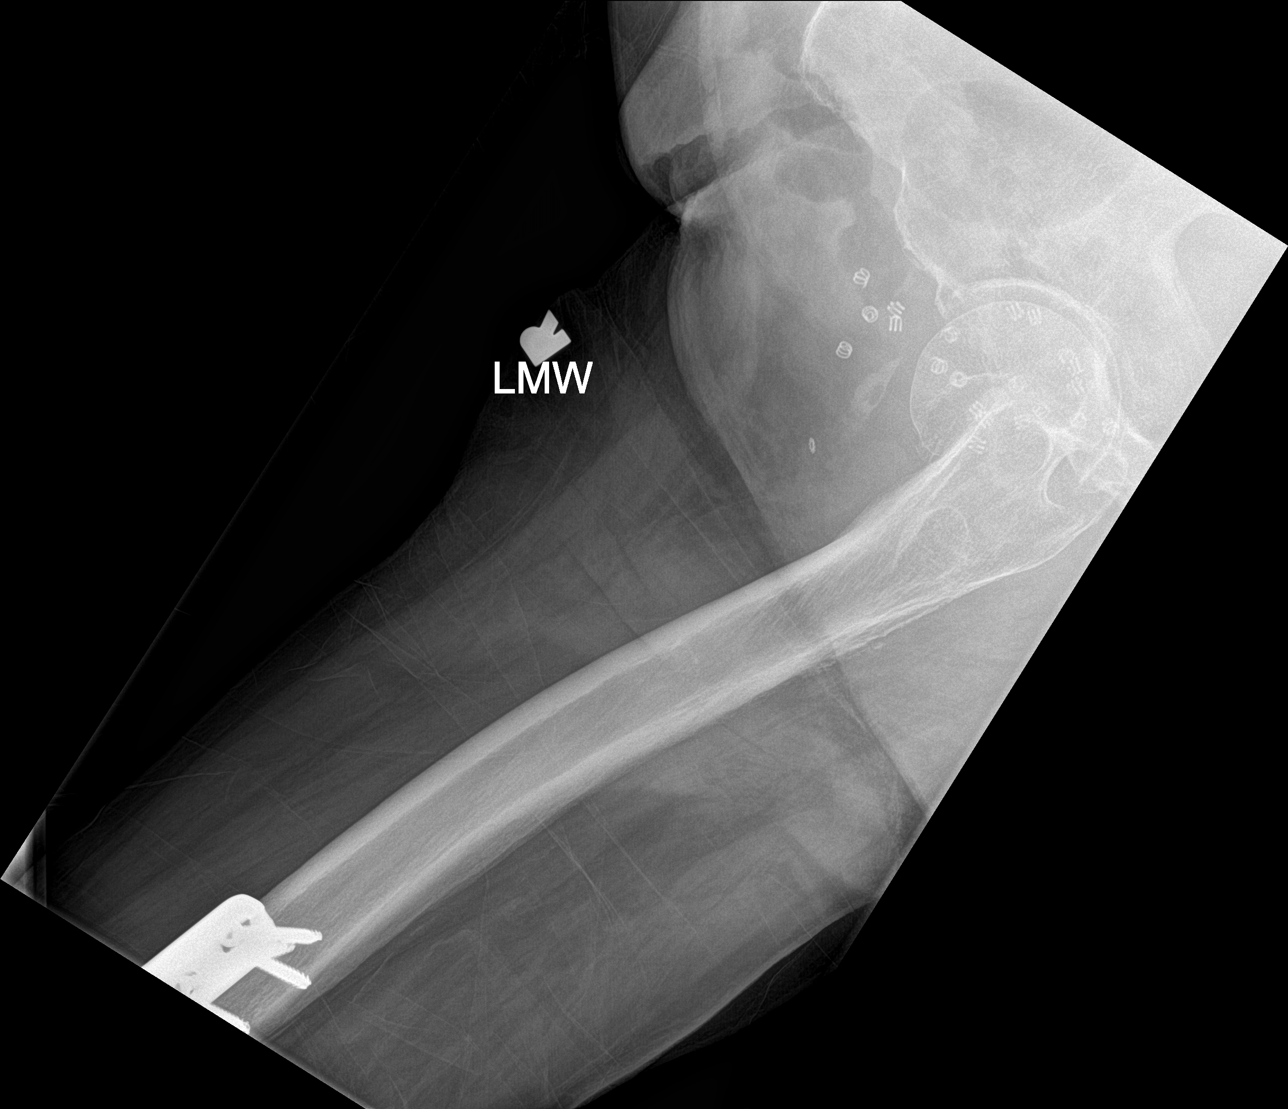

[3 of 3 positions shown; findings below may reference images not displayed]

FINDINGS: There is an acute, impacted right femoral neck fracture. Right hip
remains located. The bones are diffusely osteopenic. Pelvic ring
appears intact. Proximal left femur appears intact. Small metallic
coils related to hernia surgery project over the lower right pelvis.
IMPRESSION: Acute impacted right fracture.

## 2015-08-16 IMAGING — CR RIGHT HIP - COMPLETE 2+ VIEW
1 series · 2 of 2 positions shown · non-contrast
Comparison: 09/08/2014

CLINICAL DATA: Status post total hip replacement

EXAM:
RIGHT HIP - COMPLETE 2+ VIEW

[Series 1: ap · 0.17mm/px · 2 of 2 slices shown]
[im 1/2]
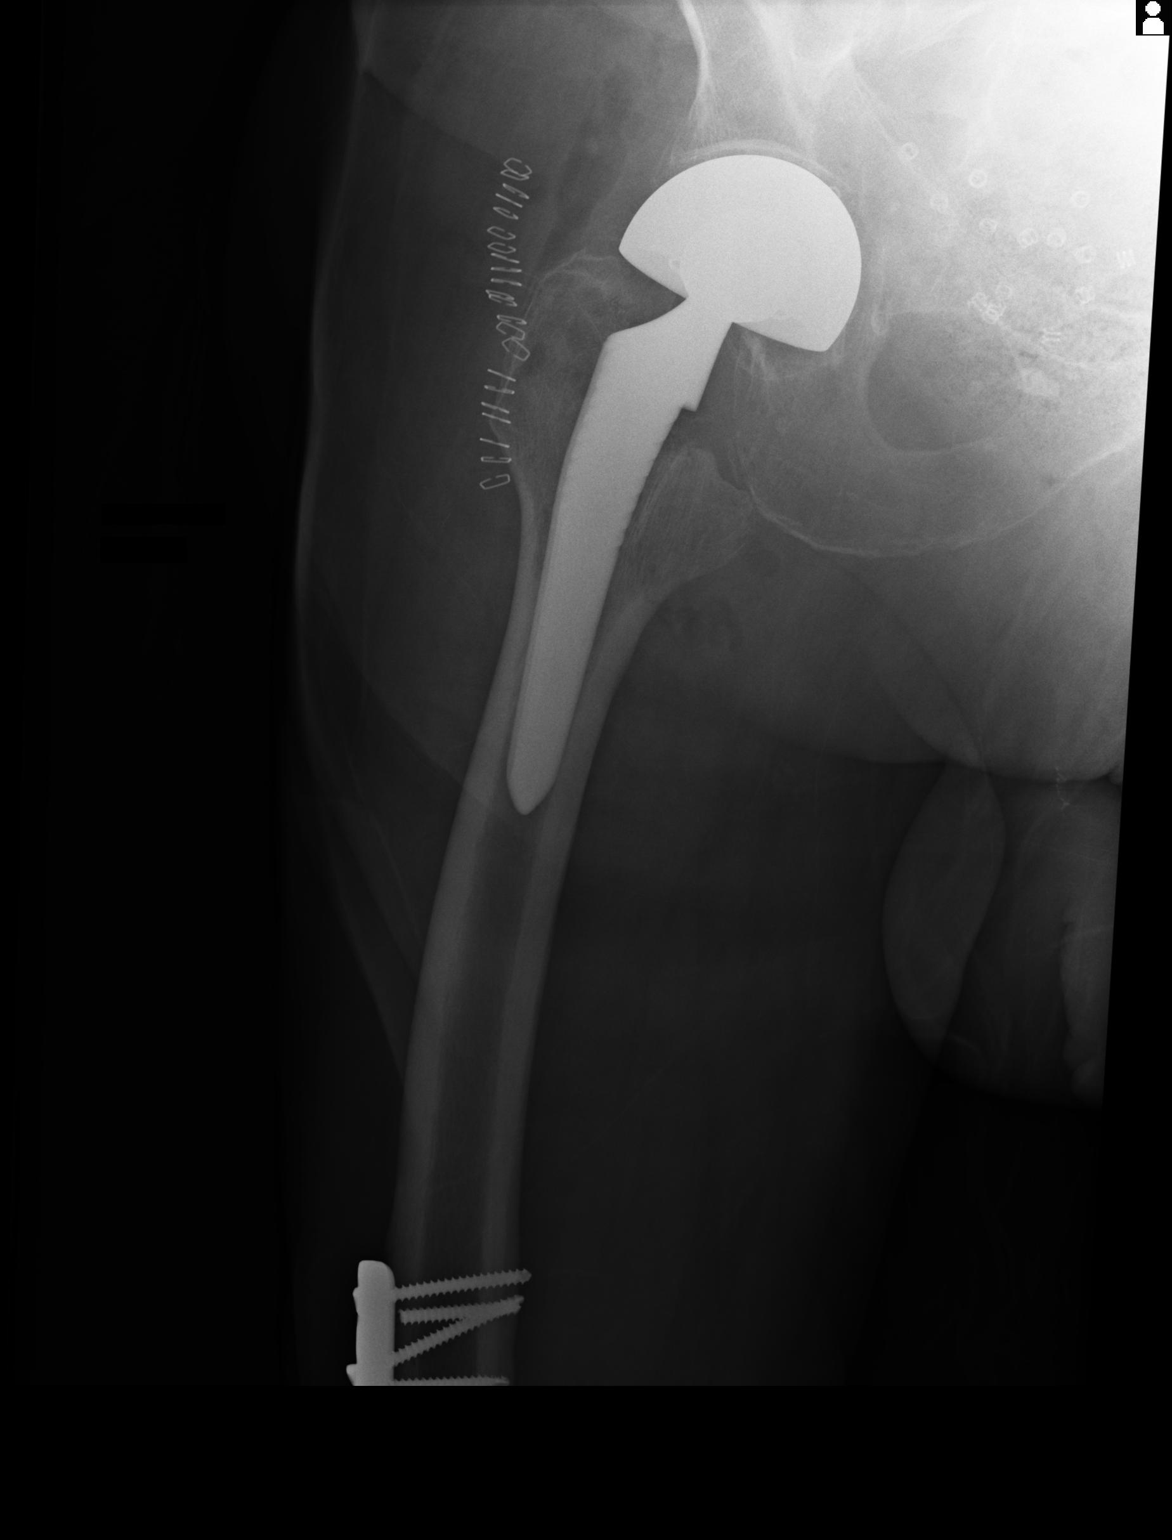
[im 2/2]
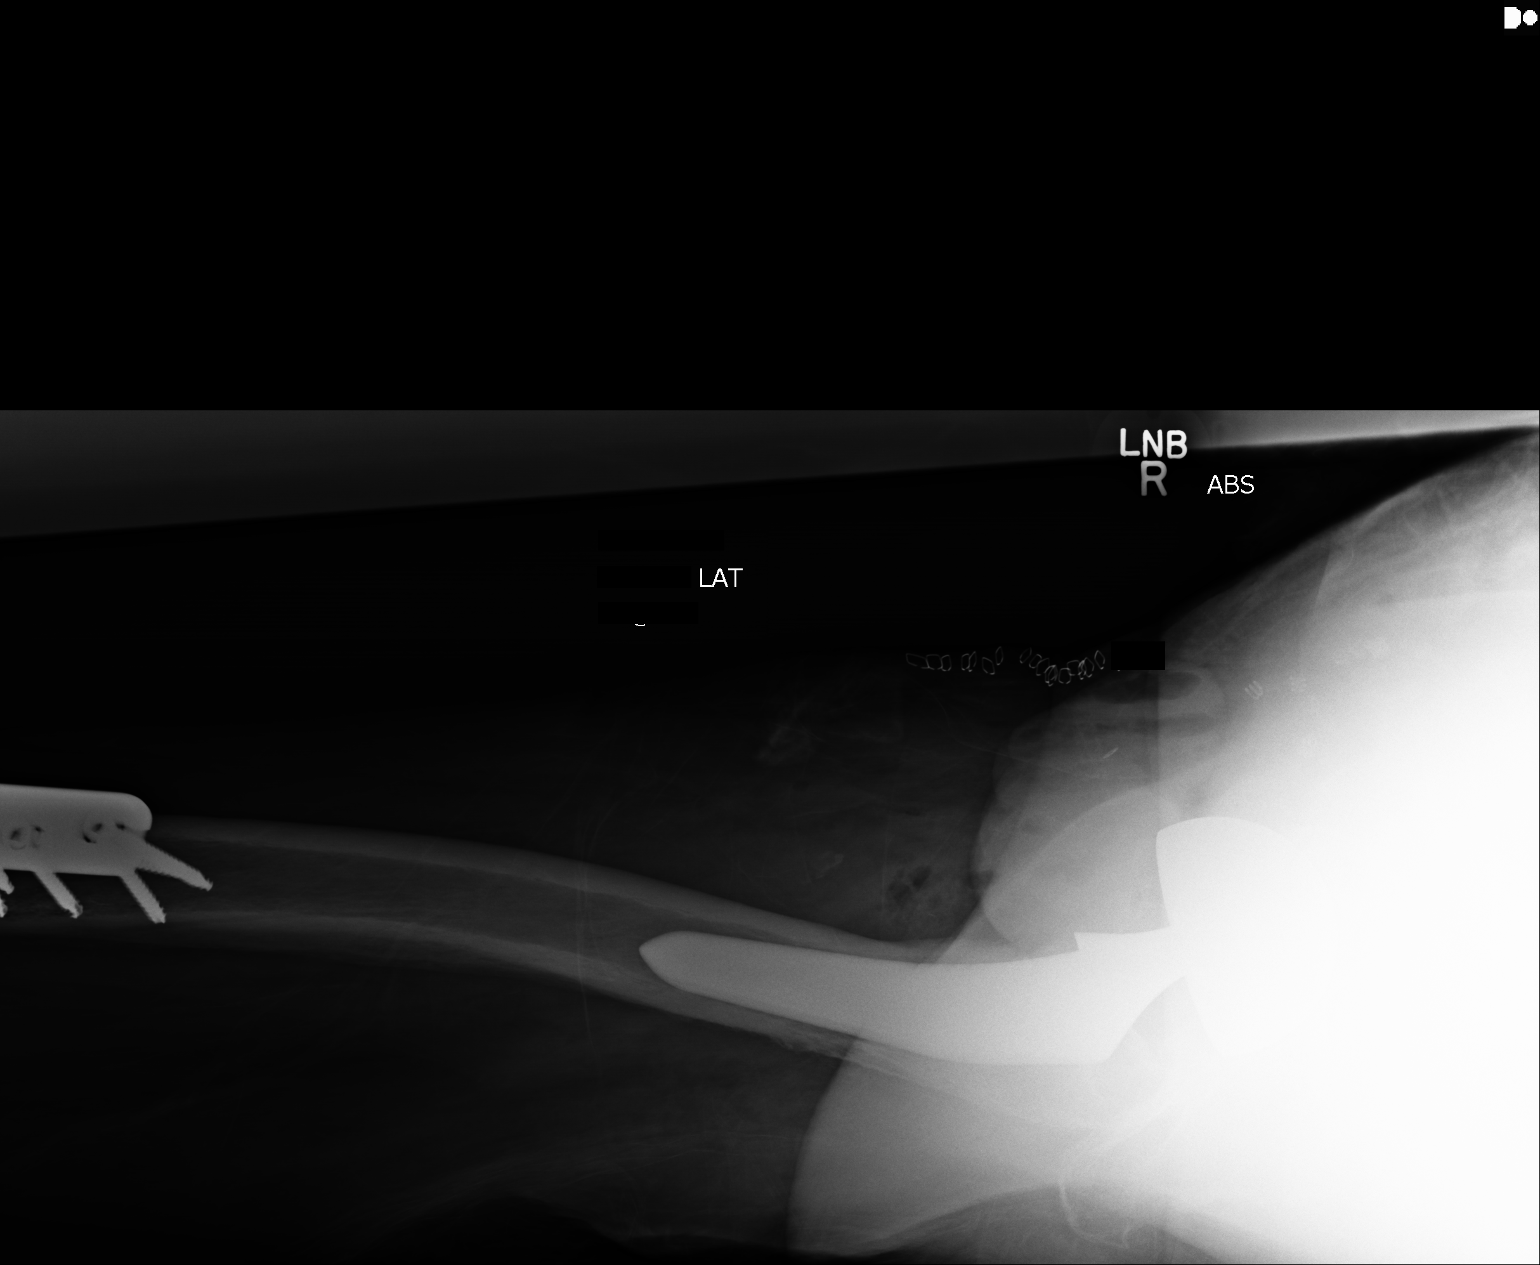

[2 of 2 positions shown; findings below may reference images not displayed]

FINDINGS: A right hip replacement is now seen. The previously noted fracture
is been removed. Postsurgical changes in the more distal femur are
seen with some evidence of hardware failure likely of a chronic
nature. No gross soft tissue abnormality is seen.
IMPRESSION: Status post right hip replacement.

## 2015-08-18 IMAGING — CR DG CHEST 1V PORT
1 series · 1 of 1 positions shown · non-contrast
Comparison: 09/08/2014.

CLINICAL DATA: Hip replacement 09/08/2014 with acute fever and
malaise.

EXAM:
PORTABLE CHEST - 1 VIEW

[ap]
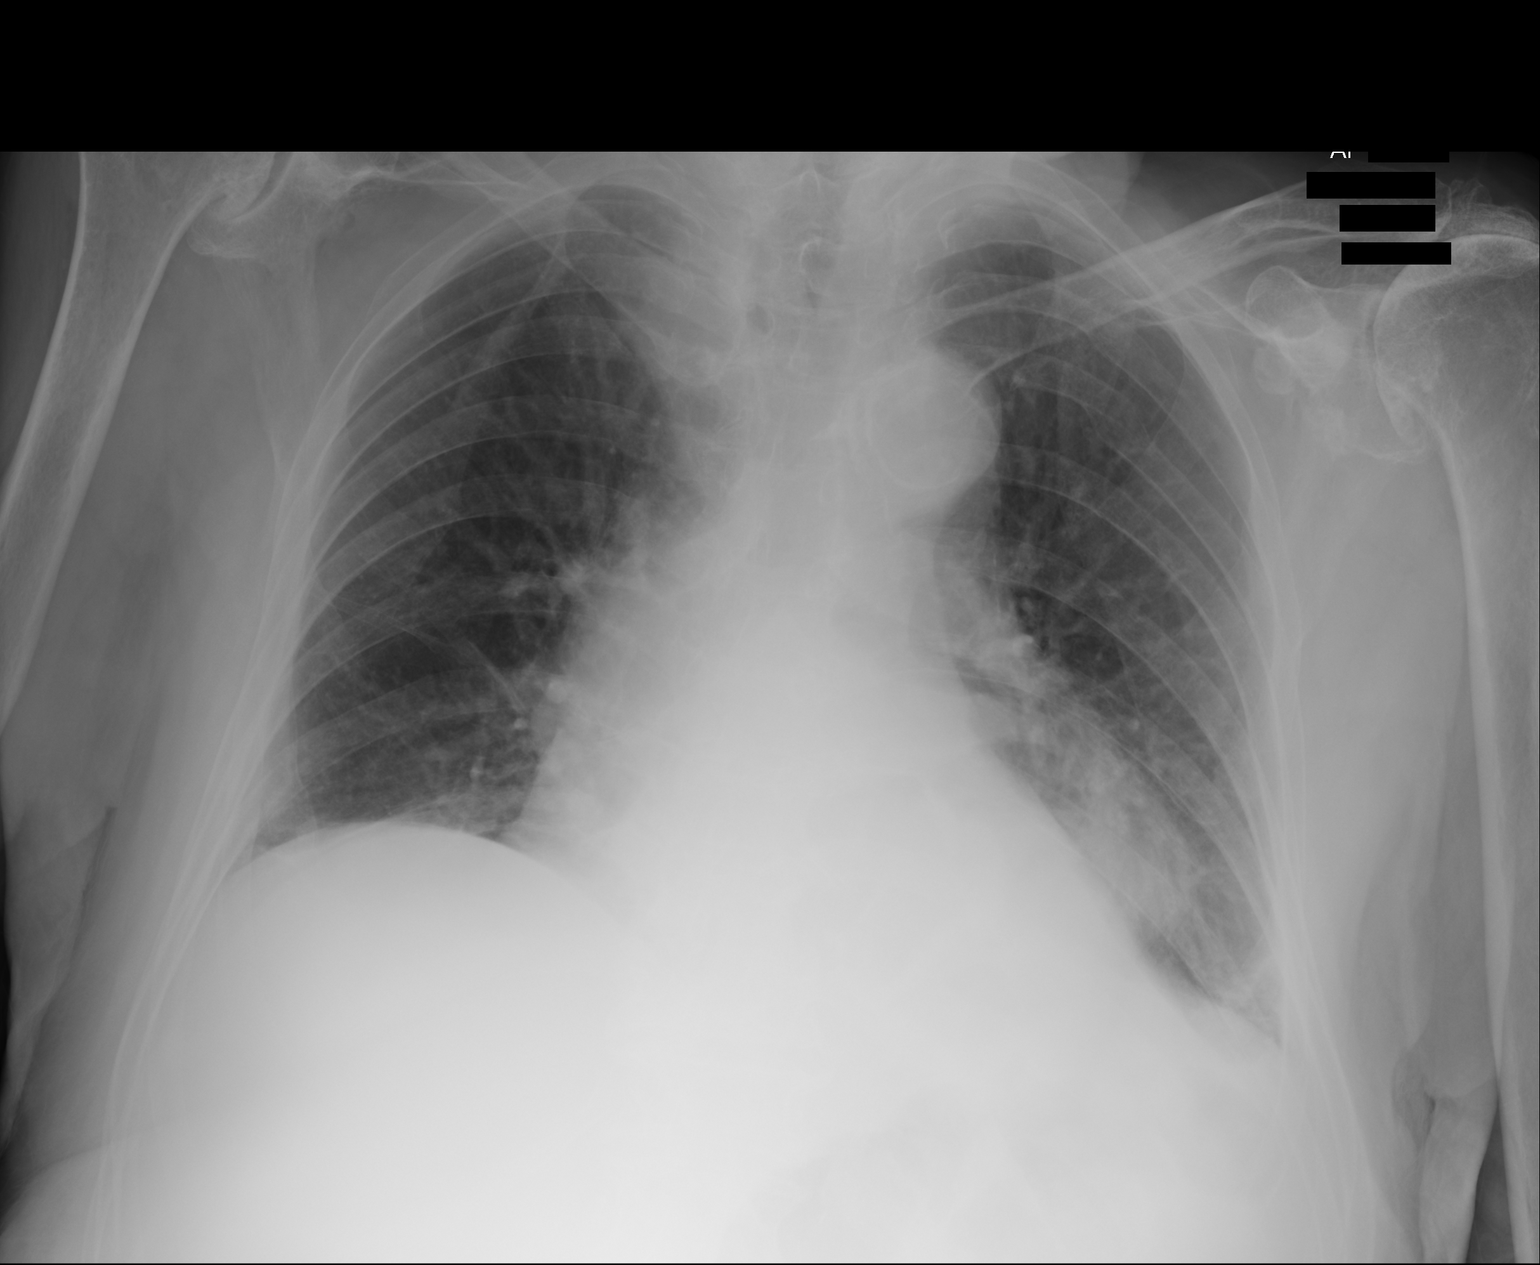

[1 of 1 positions shown; findings below may reference images not displayed]

FINDINGS: Trachea is midline. Heart is enlarged, stable. Thoracic aorta is
calcified. Lungs are somewhat low in volume with scarring or
atelectasis at the lung bases. Moderate hiatal hernia. No definite
pleural fluid. Advanced degenerative changes are seen in the
shoulders.
IMPRESSION: 1. Mild bibasilar atelectasis or scarring.
2. Moderate hiatal hernia.

## 2018-10-11 DEATH — deceased

## 2018-11-10 DEATH — deceased
